# Patient Record
Sex: Male | Born: 1955 | Race: White | Hispanic: No | Marital: Married | Smoking: Current some day smoker
Health system: Southern US, Community
[De-identification: ages and names within clinical notes are randomized; demographics above are authoritative.]

## PROBLEM LIST (undated history)

## (undated) DIAGNOSIS — K219 Gastro-esophageal reflux disease without esophagitis: Secondary | ICD-10-CM

## (undated) DIAGNOSIS — M1990 Unspecified osteoarthritis, unspecified site: Secondary | ICD-10-CM

## (undated) DIAGNOSIS — K222 Esophageal obstruction: Secondary | ICD-10-CM

## (undated) DIAGNOSIS — E785 Hyperlipidemia, unspecified: Secondary | ICD-10-CM

## (undated) DIAGNOSIS — K589 Irritable bowel syndrome without diarrhea: Secondary | ICD-10-CM

## (undated) HISTORY — DX: Esophageal obstruction: K22.2

## (undated) HISTORY — DX: Irritable bowel syndrome without diarrhea: K58.9

## (undated) HISTORY — DX: Gastro-esophageal reflux disease without esophagitis: K21.9

## (undated) HISTORY — DX: Unspecified osteoarthritis, unspecified site: M19.90

## (undated) HISTORY — DX: Hyperlipidemia, unspecified: E78.5

## (undated) HISTORY — PX: APPENDECTOMY: SHX54

## (undated) HISTORY — PX: UPPER GASTROINTESTINAL ENDOSCOPY: SHX188

## (undated) HISTORY — PX: COLONOSCOPY: SHX174

---

## 2001-05-19 ENCOUNTER — Emergency Department (HOSPITAL_COMMUNITY): Admission: EM | Admit: 2001-05-19 | Discharge: 2001-05-19 | Payer: Self-pay | Admitting: Emergency Medicine

## 2001-05-19 ENCOUNTER — Encounter: Payer: Self-pay | Admitting: Emergency Medicine

## 2001-05-25 ENCOUNTER — Emergency Department (HOSPITAL_COMMUNITY): Admission: EM | Admit: 2001-05-25 | Discharge: 2001-05-26 | Payer: Self-pay | Admitting: Emergency Medicine

## 2002-05-25 ENCOUNTER — Encounter: Payer: Self-pay | Admitting: Internal Medicine

## 2002-06-21 ENCOUNTER — Encounter: Payer: Self-pay | Admitting: Internal Medicine

## 2002-06-24 ENCOUNTER — Encounter: Payer: Self-pay | Admitting: Internal Medicine

## 2002-06-24 ENCOUNTER — Ambulatory Visit (HOSPITAL_COMMUNITY): Admission: RE | Admit: 2002-06-24 | Discharge: 2002-06-24 | Payer: Self-pay | Admitting: Internal Medicine

## 2002-07-19 ENCOUNTER — Encounter: Payer: Self-pay | Admitting: Internal Medicine

## 2002-10-21 ENCOUNTER — Encounter: Admission: RE | Admit: 2002-10-21 | Discharge: 2002-10-21 | Payer: Self-pay | Admitting: Family Medicine

## 2002-10-21 ENCOUNTER — Encounter: Payer: Self-pay | Admitting: Family Medicine

## 2003-02-20 ENCOUNTER — Encounter: Payer: Self-pay | Admitting: Internal Medicine

## 2003-03-17 ENCOUNTER — Ambulatory Visit (HOSPITAL_COMMUNITY): Admission: RE | Admit: 2003-03-17 | Discharge: 2003-03-17 | Payer: Self-pay | Admitting: Internal Medicine

## 2003-03-17 ENCOUNTER — Encounter: Payer: Self-pay | Admitting: Internal Medicine

## 2003-03-20 ENCOUNTER — Ambulatory Visit (HOSPITAL_COMMUNITY): Admission: RE | Admit: 2003-03-20 | Discharge: 2003-03-20 | Payer: Self-pay | Admitting: Internal Medicine

## 2003-03-20 ENCOUNTER — Encounter: Payer: Self-pay | Admitting: Internal Medicine

## 2003-03-22 ENCOUNTER — Emergency Department (HOSPITAL_COMMUNITY): Admission: EM | Admit: 2003-03-22 | Discharge: 2003-03-22 | Payer: Self-pay | Admitting: Emergency Medicine

## 2004-08-29 ENCOUNTER — Ambulatory Visit: Payer: Self-pay | Admitting: Family Medicine

## 2005-06-18 ENCOUNTER — Ambulatory Visit: Payer: Self-pay | Admitting: Family Medicine

## 2005-06-24 ENCOUNTER — Ambulatory Visit: Payer: Self-pay | Admitting: Family Medicine

## 2006-06-25 ENCOUNTER — Ambulatory Visit: Payer: Self-pay | Admitting: Family Medicine

## 2006-07-01 ENCOUNTER — Ambulatory Visit: Payer: Self-pay | Admitting: Family Medicine

## 2006-08-26 ENCOUNTER — Ambulatory Visit: Payer: Self-pay | Admitting: Family Medicine

## 2007-03-02 ENCOUNTER — Ambulatory Visit: Payer: Self-pay | Admitting: Family Medicine

## 2007-03-02 LAB — CONVERTED CEMR LAB
Hep B S Ab: POSITIVE — AB
Rubella: 113.2 intl units/mL — ABNORMAL HIGH
Rubeola IgG: 2.58 — ABNORMAL HIGH

## 2007-03-05 ENCOUNTER — Encounter: Payer: Self-pay | Admitting: Family Medicine

## 2007-06-09 DIAGNOSIS — E785 Hyperlipidemia, unspecified: Secondary | ICD-10-CM | POA: Insufficient documentation

## 2007-06-24 ENCOUNTER — Ambulatory Visit (HOSPITAL_COMMUNITY): Admission: RE | Admit: 2007-06-24 | Discharge: 2007-06-24 | Payer: Self-pay | Admitting: Orthopedic Surgery

## 2007-07-08 ENCOUNTER — Ambulatory Visit: Payer: Self-pay | Admitting: Family Medicine

## 2007-07-08 ENCOUNTER — Encounter: Admission: RE | Admit: 2007-07-08 | Discharge: 2007-07-08 | Payer: Self-pay | Admitting: Orthopedic Surgery

## 2007-07-13 ENCOUNTER — Encounter: Payer: Self-pay | Admitting: Family Medicine

## 2007-07-20 LAB — CONVERTED CEMR LAB
AST: 19 units/L (ref 0–37)
Albumin: 4.1 g/dL (ref 3.5–5.2)
Basophils Relative: 0.9 % (ref 0.0–1.0)
CO2: 23 meq/L (ref 19–32)
Calcium: 9.2 mg/dL (ref 8.4–10.5)
Chloride: 107 meq/L (ref 96–112)
Creatinine, Ser: 1.2 mg/dL (ref 0.4–1.5)
Eosinophils Absolute: 0.3 10*3/uL (ref 0.0–0.6)
Eosinophils Relative: 4.9 % (ref 0.0–5.0)
GFR calc non Af Amer: 68 mL/min
MCHC: 35.2 g/dL (ref 30.0–36.0)
Monocytes Absolute: 0.5 10*3/uL (ref 0.2–0.7)
Neutro Abs: 3.2 10*3/uL (ref 1.4–7.7)
Platelets: 263 10*3/uL (ref 150–400)
RBC: 4.65 M/uL (ref 4.22–5.81)
Sed Rate: 9 mm/hr (ref 0–20)
WBC: 5.4 10*3/uL (ref 4.5–10.5)

## 2007-07-28 LAB — CONVERTED CEMR LAB
Albumin ELP: 61.5 % (ref 55.8–66.1)
Alpha-2-Globulin: 9.3 % (ref 7.1–11.8)
Beta Globulin: 5.4 % (ref 4.7–7.2)
Free Lambda Lt Chains,Ur: <0.08 mg/dL (ref 0.08–1.01)
Gamma Globulin: 14.1 % (ref 11.1–18.8)
IgM, Serum: 75 mg/dL (ref 60–263)
Total Protein, Serum Electrophoresis: 7.7 g/dL (ref 6.0–8.3)
Total Protein, Urine-Ur/day: 29 mg/24hr (ref 10–140)

## 2007-12-23 ENCOUNTER — Telehealth: Payer: Self-pay | Admitting: Family Medicine

## 2008-02-03 ENCOUNTER — Ambulatory Visit: Payer: Self-pay | Admitting: Family Medicine

## 2008-02-03 DIAGNOSIS — K5289 Other specified noninfective gastroenteritis and colitis: Secondary | ICD-10-CM

## 2008-02-03 DIAGNOSIS — E860 Dehydration: Secondary | ICD-10-CM | POA: Insufficient documentation

## 2008-02-03 DIAGNOSIS — J309 Allergic rhinitis, unspecified: Secondary | ICD-10-CM | POA: Insufficient documentation

## 2008-04-19 ENCOUNTER — Ambulatory Visit: Payer: Self-pay | Admitting: Family Medicine

## 2008-04-19 DIAGNOSIS — K645 Perianal venous thrombosis: Secondary | ICD-10-CM

## 2008-06-06 ENCOUNTER — Encounter: Payer: Self-pay | Admitting: Family Medicine

## 2008-08-03 ENCOUNTER — Ambulatory Visit: Payer: Self-pay | Admitting: Family Medicine

## 2008-08-03 DIAGNOSIS — R51 Headache: Secondary | ICD-10-CM | POA: Insufficient documentation

## 2008-08-03 DIAGNOSIS — R0789 Other chest pain: Secondary | ICD-10-CM

## 2008-08-03 DIAGNOSIS — S20219A Contusion of unspecified front wall of thorax, initial encounter: Secondary | ICD-10-CM | POA: Insufficient documentation

## 2008-08-03 DIAGNOSIS — R519 Headache, unspecified: Secondary | ICD-10-CM | POA: Insufficient documentation

## 2008-08-07 ENCOUNTER — Telehealth: Payer: Self-pay | Admitting: Family Medicine

## 2008-08-08 ENCOUNTER — Ambulatory Visit: Payer: Self-pay | Admitting: Family Medicine

## 2008-08-08 DIAGNOSIS — J019 Acute sinusitis, unspecified: Secondary | ICD-10-CM

## 2008-08-10 ENCOUNTER — Telehealth: Payer: Self-pay | Admitting: Family Medicine

## 2008-08-14 ENCOUNTER — Encounter: Admission: RE | Admit: 2008-08-14 | Discharge: 2008-08-14 | Payer: Self-pay | Admitting: Family Medicine

## 2008-08-14 ENCOUNTER — Encounter: Payer: Self-pay | Admitting: Nurse Practitioner

## 2008-08-14 ENCOUNTER — Telehealth (INDEPENDENT_AMBULATORY_CARE_PROVIDER_SITE_OTHER): Payer: Self-pay | Admitting: *Deleted

## 2008-08-14 ENCOUNTER — Ambulatory Visit: Payer: Self-pay | Admitting: Family Medicine

## 2008-08-15 ENCOUNTER — Inpatient Hospital Stay (HOSPITAL_COMMUNITY): Admission: AD | Admit: 2008-08-15 | Discharge: 2008-08-17 | Payer: Self-pay | Admitting: Family Medicine

## 2008-08-15 ENCOUNTER — Ambulatory Visit: Payer: Self-pay | Admitting: Family Medicine

## 2008-08-15 ENCOUNTER — Telehealth: Payer: Self-pay | Admitting: Family Medicine

## 2008-08-15 ENCOUNTER — Ambulatory Visit: Payer: Self-pay | Admitting: Internal Medicine

## 2008-08-15 DIAGNOSIS — R079 Chest pain, unspecified: Secondary | ICD-10-CM | POA: Insufficient documentation

## 2008-08-15 LAB — CONVERTED CEMR LAB
Albumin: 4.2 g/dL (ref 3.5–5.2)
Alkaline Phosphatase: 54 units/L (ref 39–117)
BUN: 14 mg/dL (ref 6–23)
Basophils Absolute: 0 10*3/uL (ref 0.0–0.1)
Basophils Relative: 0.6 % (ref 0.0–3.0)
CO2: 26 meq/L (ref 19–32)
Calcium: 9.1 mg/dL (ref 8.4–10.5)
Creatinine, Ser: 1.1 mg/dL (ref 0.4–1.5)
Eosinophils Absolute: 0.2 10*3/uL (ref 0.0–0.7)
Eosinophils Relative: 2.9 % (ref 0.0–5.0)
GFR calc non Af Amer: 75 mL/min
Glucose, Bld: 83 mg/dL (ref 70–99)
Lymphocytes Relative: 23.8 % (ref 12.0–46.0)
MCHC: 35.4 g/dL (ref 30.0–36.0)
Monocytes Relative: 8.5 % (ref 3.0–12.0)
Neutro Abs: 4.2 10*3/uL (ref 1.4–7.7)
Potassium: 4.2 meq/L (ref 3.5–5.1)
RBC: 5.11 M/uL (ref 4.22–5.81)
Sodium: 140 meq/L (ref 135–145)
Total Bilirubin: 1.2 mg/dL (ref 0.3–1.2)
Total Protein: 7.4 g/dL (ref 6.0–8.3)

## 2008-08-17 ENCOUNTER — Encounter (INDEPENDENT_AMBULATORY_CARE_PROVIDER_SITE_OTHER): Payer: Self-pay | Admitting: *Deleted

## 2008-08-17 ENCOUNTER — Telehealth: Payer: Self-pay | Admitting: Family Medicine

## 2008-08-21 ENCOUNTER — Telehealth: Payer: Self-pay | Admitting: Family Medicine

## 2008-08-22 ENCOUNTER — Ambulatory Visit: Payer: Self-pay | Admitting: Family Medicine

## 2008-08-23 ENCOUNTER — Ambulatory Visit: Payer: Self-pay | Admitting: Nurse Practitioner

## 2008-08-23 DIAGNOSIS — R197 Diarrhea, unspecified: Secondary | ICD-10-CM | POA: Insufficient documentation

## 2008-08-23 DIAGNOSIS — K589 Irritable bowel syndrome without diarrhea: Secondary | ICD-10-CM

## 2008-08-23 DIAGNOSIS — R1319 Other dysphagia: Secondary | ICD-10-CM

## 2008-08-23 DIAGNOSIS — R1084 Generalized abdominal pain: Secondary | ICD-10-CM

## 2008-08-23 DIAGNOSIS — R143 Flatulence: Secondary | ICD-10-CM

## 2008-08-23 DIAGNOSIS — R141 Gas pain: Secondary | ICD-10-CM | POA: Insufficient documentation

## 2008-08-23 DIAGNOSIS — R933 Abnormal findings on diagnostic imaging of other parts of digestive tract: Secondary | ICD-10-CM

## 2008-08-23 DIAGNOSIS — R112 Nausea with vomiting, unspecified: Secondary | ICD-10-CM

## 2008-08-23 DIAGNOSIS — R142 Eructation: Secondary | ICD-10-CM

## 2008-08-28 ENCOUNTER — Telehealth: Payer: Self-pay | Admitting: Gastroenterology

## 2008-08-28 ENCOUNTER — Telehealth: Payer: Self-pay | Admitting: Nurse Practitioner

## 2008-08-29 ENCOUNTER — Emergency Department (HOSPITAL_COMMUNITY): Admission: EM | Admit: 2008-08-29 | Discharge: 2008-08-29 | Payer: Self-pay | Admitting: Emergency Medicine

## 2008-08-29 ENCOUNTER — Telehealth: Payer: Self-pay | Admitting: Nurse Practitioner

## 2008-08-30 ENCOUNTER — Ambulatory Visit: Payer: Self-pay | Admitting: Family Medicine

## 2008-08-30 DIAGNOSIS — K219 Gastro-esophageal reflux disease without esophagitis: Secondary | ICD-10-CM | POA: Insufficient documentation

## 2008-08-30 DIAGNOSIS — R1013 Epigastric pain: Secondary | ICD-10-CM | POA: Insufficient documentation

## 2008-08-31 ENCOUNTER — Ambulatory Visit: Payer: Self-pay | Admitting: Internal Medicine

## 2008-08-31 DIAGNOSIS — Z9283 Personal history of failed moderate sedation: Secondary | ICD-10-CM

## 2008-09-01 ENCOUNTER — Telehealth: Payer: Self-pay | Admitting: Family Medicine

## 2008-09-01 ENCOUNTER — Encounter: Payer: Self-pay | Admitting: Internal Medicine

## 2008-09-01 ENCOUNTER — Telehealth: Payer: Self-pay | Admitting: Internal Medicine

## 2008-09-19 ENCOUNTER — Ambulatory Visit: Payer: Self-pay | Admitting: Internal Medicine

## 2008-09-29 DIAGNOSIS — K222 Esophageal obstruction: Secondary | ICD-10-CM

## 2008-09-29 HISTORY — DX: Esophageal obstruction: K22.2

## 2008-10-02 ENCOUNTER — Telehealth: Payer: Self-pay | Admitting: Internal Medicine

## 2008-10-05 ENCOUNTER — Ambulatory Visit: Payer: Self-pay | Admitting: Internal Medicine

## 2008-10-05 ENCOUNTER — Ambulatory Visit (HOSPITAL_COMMUNITY): Admission: RE | Admit: 2008-10-05 | Discharge: 2008-10-05 | Payer: Self-pay | Admitting: Internal Medicine

## 2008-10-05 LAB — HM COLONOSCOPY

## 2008-11-18 IMAGING — CT CT ABDOMEN W/ CM
3 of 5 series · 17 of 46 positions shown, 19 images · IV contrast ([ID] [id number])
Comparison: None available

CT ABDOMEN

CLINICAL DATA: Abdominal pain and vomiting, mid lower abdominal
pain.  Prior appendectomy

CT ABDOMEN AND PELVIS WITH CONTRAST
TECHNIQUE: Multidetector CT imaging of the abdomen and pelvis was
performed using the standard protocol following bolus
administration of intravenous contrast.
Contrast: 100 ml  Omni 300

[Series 2: abdomen w/ · axial · 0.70mm/px · z∈[-363,-43]mm · 10 of 80 slices shown, 12 images]
[im 8/80  soft-tissue]
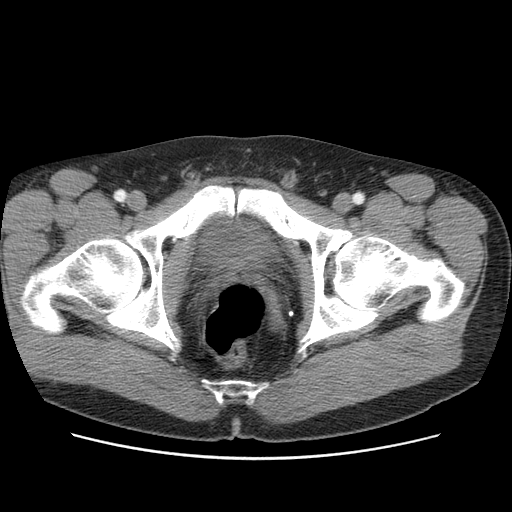
[im 8/80  bone]
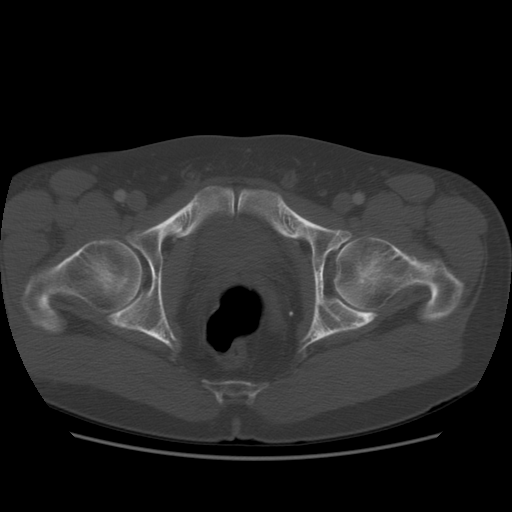
[im 15/80  soft-tissue]
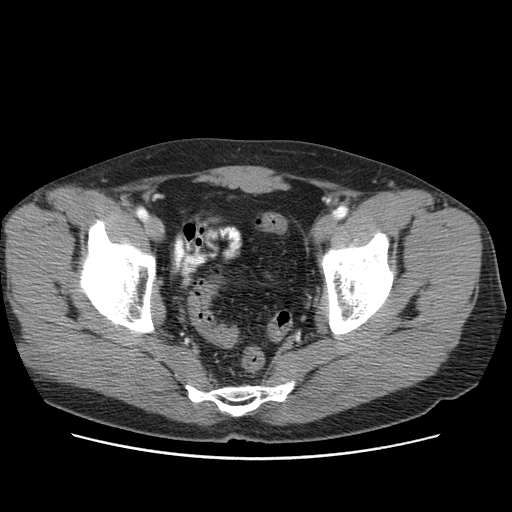
[im 22/80  soft-tissue]
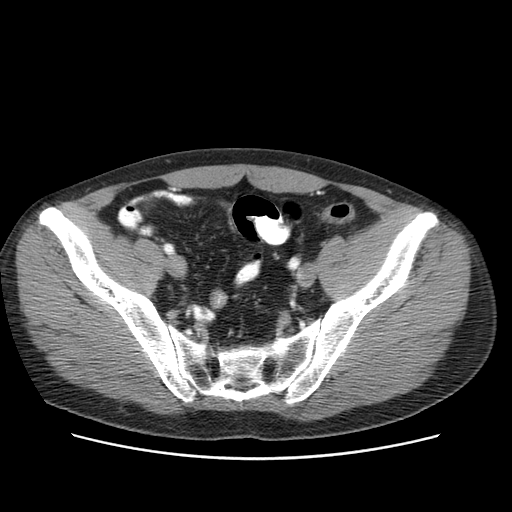
[im 29/80  soft-tissue]
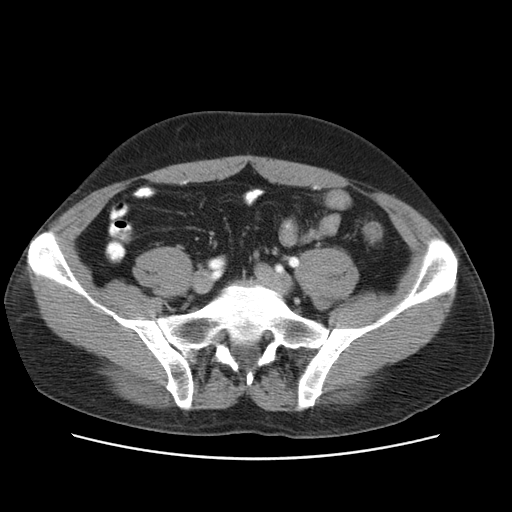
[im 36/80  soft-tissue]
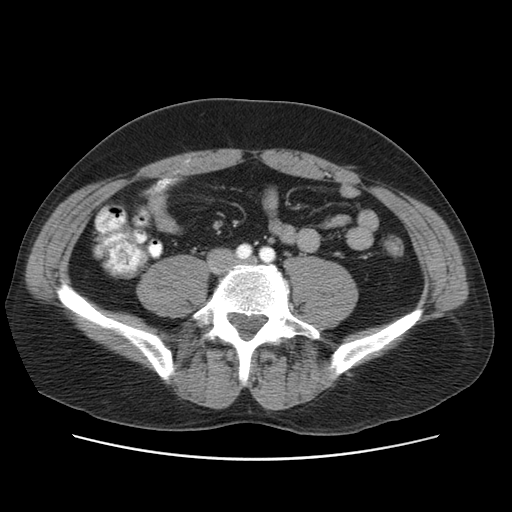
[im 44/80  soft-tissue]
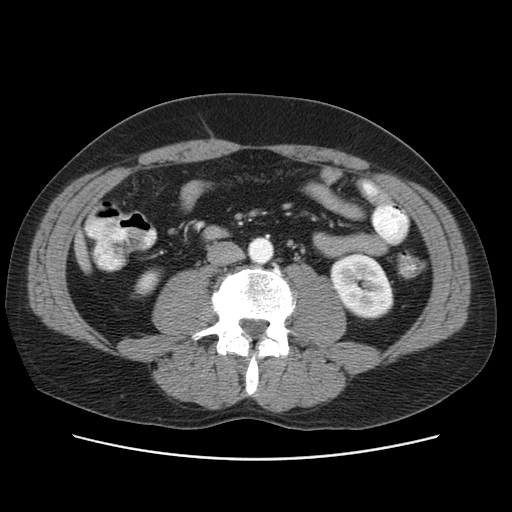
[im 51/80  soft-tissue]
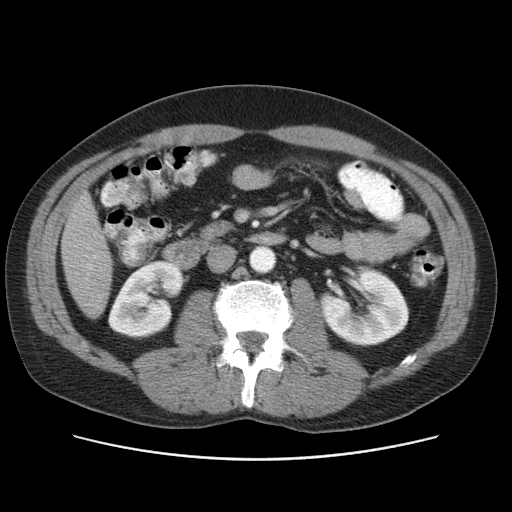
[im 58/80  soft-tissue]
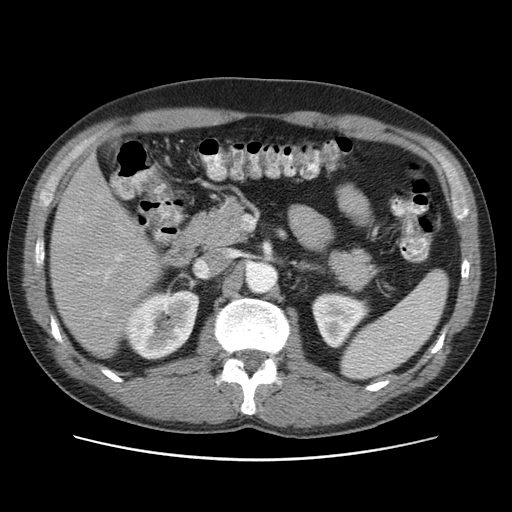
[im 65/80  soft-tissue]
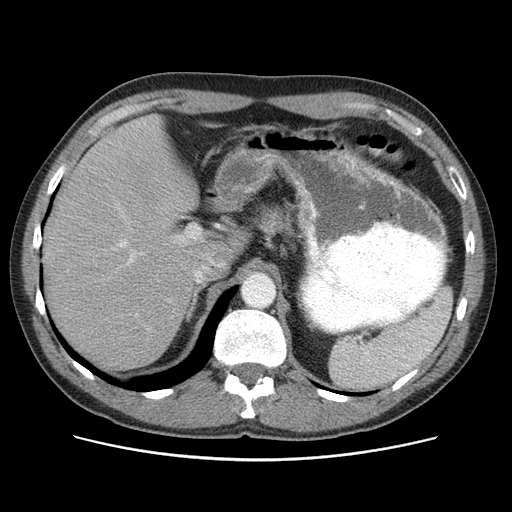
[im 65/80  bone]
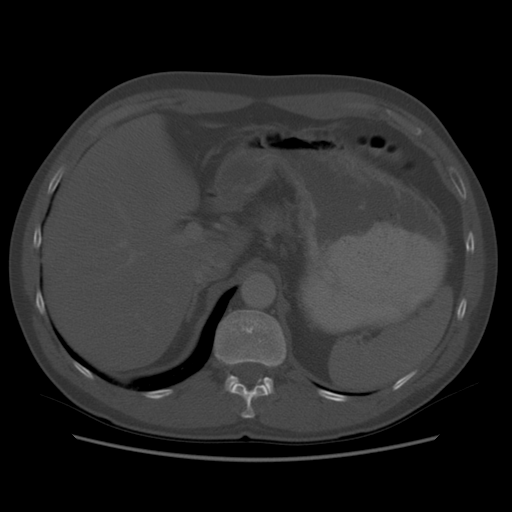
[im 72/80  soft-tissue]
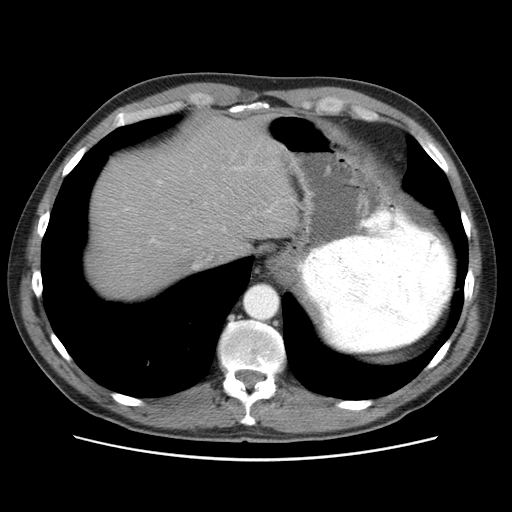

[Series 3: lung windows · axial · 0.70mm/px · z∈[-438,-273]mm · 4 of 94 slices shown]
[im 7/94  bone]
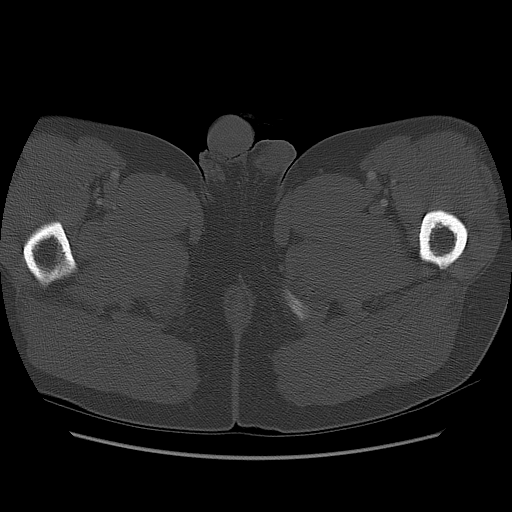
[im 20/94  bone]
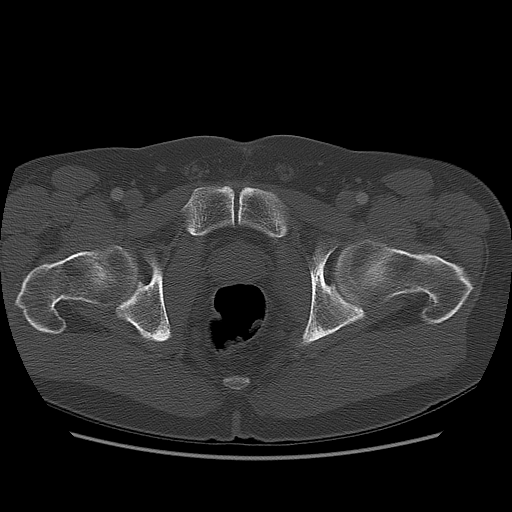
[im 34/94  bone]
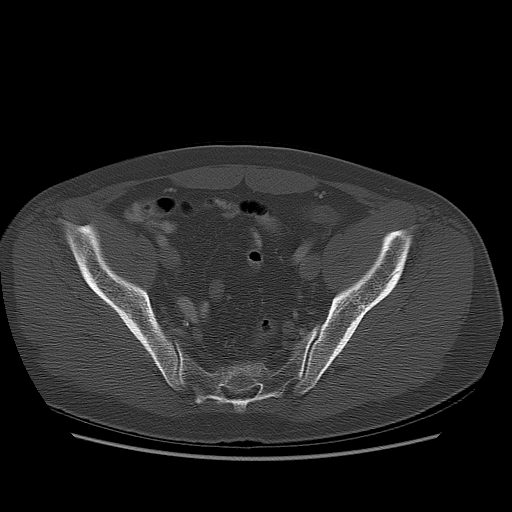
[im 40/94  bone]
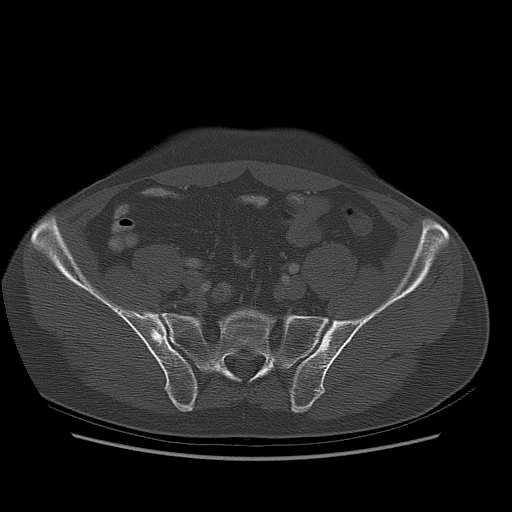

[Series 104: cor · coronal · 0.93mm/px · 3 of 99 slices shown]
[im 33/99  soft-tissue]
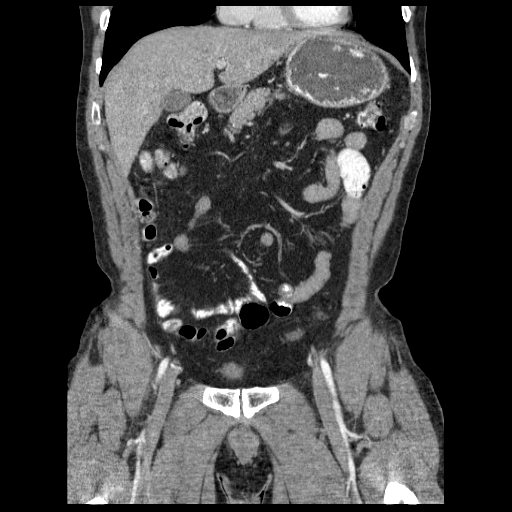
[im 44/99  soft-tissue]
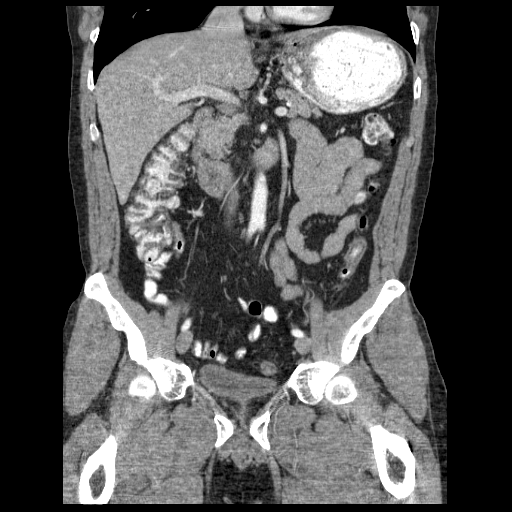
[im 55/99  soft-tissue]
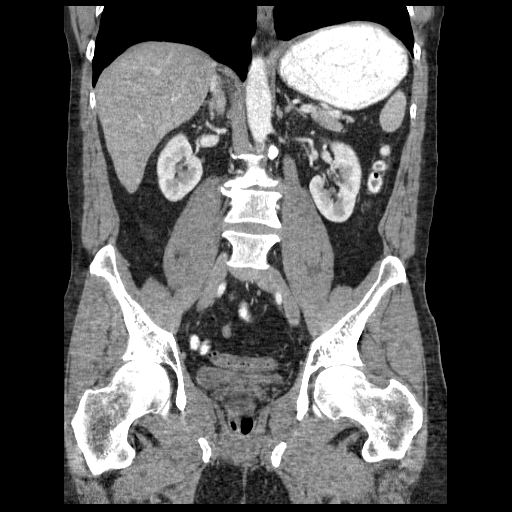

[17 of 46 positions shown; findings below may reference images not displayed]

FINDINGS: Lung bases are clear.  Base of the heart appears normal.

No focal hepatic lesion.  The gallbladder, pancreas, spleen,
adrenal glands, and kidneys appear normal.

The stomach, duodenum, small bowel, and cecum appear normal.  There
is very mild pericolonic stranding along the distal portion of the
descending colon over approximately 5 cm segment.  There is mild
diverticular disease in this region.  There is more prominent
diverticulosis of the sigmoid colon but no clear evidence of acute
inflammation.  No evidence of abscess or perforation.

The abdominal aorta is normal in caliber.  No evidence of
retroperitoneal lymphadenopathy.
IMPRESSION: Mild pericolonic stranding of the descending colon.  Differential
includes mild / early diverticulitis or potential segmental
colitis.

CT PELVIS
FINDINGS: The bladder, prostate, seminal vesicles appear normal.
The rectum appears normal.  There is scattered diverticula of the
sigmoid colon without clear evidence of inflammation.

No evidence of pelvic lymphadenopathy. Review of  bone windows
demonstrates no aggressive osseous lesions. There is loss of disc
height at L3-L4 with associated endplate osteophytosis.  No
subluxation.
IMPRESSION: 1.  No acute pelvic process.
2.  Sigmoid diverticulosis.
3.  Degenerative disc disease at L3-L4.

## 2009-05-16 ENCOUNTER — Ambulatory Visit: Payer: Self-pay | Admitting: Family Medicine

## 2009-05-23 ENCOUNTER — Ambulatory Visit: Payer: Self-pay | Admitting: Family Medicine

## 2009-05-23 DIAGNOSIS — F988 Other specified behavioral and emotional disorders with onset usually occurring in childhood and adolescence: Secondary | ICD-10-CM | POA: Insufficient documentation

## 2009-05-23 DIAGNOSIS — M1710 Unilateral primary osteoarthritis, unspecified knee: Secondary | ICD-10-CM | POA: Insufficient documentation

## 2009-05-23 DIAGNOSIS — IMO0002 Reserved for concepts with insufficient information to code with codable children: Secondary | ICD-10-CM

## 2009-05-28 ENCOUNTER — Encounter: Payer: Self-pay | Admitting: Family Medicine

## 2009-06-02 LAB — CONVERTED CEMR LAB
ALT: 22 units/L (ref 0–53)
AST: 24 units/L (ref 0–37)
Albumin: 3.9 g/dL (ref 3.5–5.2)
Basophils Absolute: 0.1 10*3/uL (ref 0.0–0.1)
Bilirubin Urine: NEGATIVE
Direct LDL: 123.1 mg/dL
Glucose, Bld: 108 mg/dL — ABNORMAL HIGH (ref 70–99)
HCT: 42 % (ref 39.0–52.0)
HDL: 29.2 mg/dL — ABNORMAL LOW (ref >39.00)
Hemoglobin, Urine: NEGATIVE
Hemoglobin: 14.7 g/dL (ref 13.0–17.0)
Leukocytes, UA: NEGATIVE
Lymphocytes Relative: 27 % (ref 12.0–46.0)
MCHC: 35.1 g/dL (ref 30.0–36.0)
Neutro Abs: 2.8 10*3/uL (ref 1.4–7.7)
Neutrophils Relative %: 56.6 % (ref 43.0–77.0)
PSA: 0.42 ng/mL (ref 0.10–4.00)
RDW: 12 % (ref 11.5–14.6)
Specific Gravity, Urine: 1.025 (ref 1.000–1.030)
TSH: 2.89 microintl units/mL (ref 0.35–5.50)
Total Bilirubin: 0.9 mg/dL (ref 0.3–1.2)
Total CHOL/HDL Ratio: 8
Total Protein: 6.8 g/dL (ref 6.0–8.3)
Urine Glucose: NEGATIVE mg/dL
WBC: 5 10*3/uL (ref 4.5–10.5)

## 2009-07-11 ENCOUNTER — Ambulatory Visit: Payer: Self-pay | Admitting: Family Medicine

## 2009-07-11 DIAGNOSIS — F341 Dysthymic disorder: Secondary | ICD-10-CM | POA: Insufficient documentation

## 2009-09-11 ENCOUNTER — Telehealth: Payer: Self-pay | Admitting: Family Medicine

## 2009-10-31 ENCOUNTER — Telehealth: Payer: Self-pay | Admitting: Family Medicine

## 2009-11-07 ENCOUNTER — Telehealth: Payer: Self-pay | Admitting: Family Medicine

## 2009-11-07 ENCOUNTER — Ambulatory Visit: Payer: Self-pay | Admitting: Family Medicine

## 2009-11-07 DIAGNOSIS — J1189 Influenza due to unidentified influenza virus with other manifestations: Secondary | ICD-10-CM | POA: Insufficient documentation

## 2009-11-08 LAB — CONVERTED CEMR LAB
Basophils Absolute: 0 10*3/uL (ref 0.0–0.1)
Eosinophils Relative: 1.5 % (ref 0.0–5.0)
HCT: 42.2 % (ref 39.0–52.0)
Lymphocytes Relative: 9.4 % — ABNORMAL LOW (ref 12.0–46.0)
Lymphs Abs: 0.4 10*3/uL — ABNORMAL LOW (ref 0.7–4.0)
MCV: 98.3 fL (ref 78.0–100.0)
Monocytes Absolute: 0.6 10*3/uL (ref 0.1–1.0)
Neutro Abs: 3.3 10*3/uL (ref 1.4–7.7)
RBC: 4.29 M/uL (ref 4.22–5.81)

## 2010-05-16 ENCOUNTER — Telehealth: Payer: Self-pay | Admitting: Family Medicine

## 2010-05-30 ENCOUNTER — Telehealth: Payer: Self-pay | Admitting: Family Medicine

## 2010-06-14 ENCOUNTER — Telehealth: Payer: Self-pay | Admitting: Family Medicine

## 2010-09-04 ENCOUNTER — Telehealth: Payer: Self-pay | Admitting: Family Medicine

## 2010-10-20 ENCOUNTER — Encounter: Payer: Self-pay | Admitting: Family Medicine

## 2010-10-27 LAB — CONVERTED CEMR LAB
Alkaline Phosphatase: 57 units/L (ref 39–117)
Basophils Absolute: 0 10*3/uL (ref 0.0–0.1)
Blood in Urine, dipstick: NEGATIVE
Chloride: 106 meq/L (ref 96–112)
GFR calc Af Amer: 102 mL/min
GFR calc non Af Amer: 84 mL/min
Hemoglobin: 16 g/dL (ref 13.0–17.0)
Ketones, urine, test strip: NEGATIVE
Lymphocytes Relative: 21.9 % (ref 12.0–46.0)
MCHC: 35.5 g/dL (ref 30.0–36.0)
Neutro Abs: 5 10*3/uL (ref 1.4–7.7)
Nitrite: NEGATIVE
PSA: 0.56 ng/mL (ref 0.10–4.00)
Platelets: 227 10*3/uL (ref 150–400)
RDW: 11.6 % (ref 11.5–14.6)
Sodium: 138 meq/L (ref 135–145)
VLDL: 29 mg/dL (ref 0–40)
Vit D, 1,25-Dihydroxy: 30 (ref 30–89)
WBC Urine, dipstick: NEGATIVE
WBC: 7.4 10*3/uL (ref 4.5–10.5)
pH: 6.5

## 2010-10-29 NOTE — Progress Notes (Signed)
Summary: CALL CONCERNING RX  Phone Note Call from Patient   Caller: Patient [phone] Reason for Call: Talk to Nurse, Talk to Doctor Summary of Call: Pt called to adv that he wasn't given a RX for his cough med: HYDROMET 5-1.5 MG/5ML SYRP (HYDROCODONE-HOMATROPINE) .Marland Kitchen... Pt went to pharmacy to p/u meds and they adv him that they only received RX for Tamiflu.... EMR shows that the Hydromet Syrup was printed and given to pt.... However, pt adv he never received same...Marland KitchenMarland KitchenAlmira Coaster, RN presently n/a.  Pt can be reached at [phone]  -or-  [phone].  Initial call taken by: Debbra Riding,  November 07, 2009 12:06 PM  Follow-up for Phone Call        Phone Call Completed----Spoke with pt.... Pt was adv that Almira Coaster, RN stated that she would call pharmacy to verify that RX was received. Follow-up by: Debbra Riding,  November 07, 2009 12:16 PM

## 2010-10-29 NOTE — Progress Notes (Signed)
Summary: wants rx for adderall   Phone Note Call from Patient   Caller: Patient Summary of Call: pt called to say would pick up rx. for his ADD med  per Nelva Bush rx not at desk and pt did not siign log sheet indicatiing he picked up is adderall. Initial call taken by: Pura Spice, RN,  May 30, 2010 11:41 AM  Follow-up for Phone Call        Please call the two pharmacies listed in his chart and see if he has picked up any Adderall since july if he has not then he can have a one month supply now and another in 1 month Follow-up by: Danise Edge MD,  May 30, 2010 4:09 PM  Additional Follow-up for Phone Call Additional follow up Details #1::        OK to give him the 30 day supply and he can have another refill next month. [facility]  last filled in May 2011 Additional Follow-up by: Danise Edge MD,  May 30, 2010 4:31 PM     Appended Document: wants rx for adderall     Clinical Lists Changes  Medications: Changed medication from ADDERALL 20 MG TABS (AMPHETAMINE-DEXTROAMPHETAMINE) 1 morning  and evening meal for  ADD fill May 17 2010 to ADDERALL 20 MG TABS (AMPHETAMINE-DEXTROAMPHETAMINE) 1 morning  and evening meal for  ADD - Signed Rx of ADDERALL 20 MG TABS (AMPHETAMINE-DEXTROAMPHETAMINE) 1 morning  and evening meal for  ADD;  #60 x 0;  Signed;  Entered by: Josph Macho RMA;  Authorized by: Danise Edge MD;  Method used: Print then Give to Patient    Prescriptions: ADDERALL 20 MG TABS (AMPHETAMINE-DEXTROAMPHETAMINE) 1 morning  and evening meal for  ADD  #60 x 0   Entered by:   Josph Macho RMA   Authorized by:   Danise Edge MD   Signed by:   Josph Macho RMA on 05/30/2010   Method used:   Print then Give to Patient   RxID:   [id number]   RX in front cabinet/ CF

## 2010-10-29 NOTE — Progress Notes (Signed)
Summary: new rx 3 months  Phone Note Refill Request Call back at [phone] Message from:  Patient  Refills Requested: Medication #1:  ADDERALL 20 MG TABS 1 morning lunch and evening meal for  ADD pt is requesting generic med and also written 3 month supply  Initial call taken by: Heron Sabins,  May 16, 2010 9:10 AM  Follow-up for Phone Call        ok per dr Scotty Court.  can pick up today rx front desk  Follow-up by: Pura Spice, RN,  May 16, 2010 2:50 PM    New/Updated Medications: ADDERALL 20 MG TABS (AMPHETAMINE-DEXTROAMPHETAMINE) 1 morning  and evening meal for  ADD fill May 17 2010 ADDERALL 20 MG TABS (AMPHETAMINE-DEXTROAMPHETAMINE) One by mouth morning and evening Fill on 06/17/2010 ADDERALL 20 MG TABS (AMPHETAMINE-DEXTROAMPHETAMINE) one morning and evening Fill on Jul 17 2010 Prescriptions: ADDERALL 20 MG TABS (AMPHETAMINE-DEXTROAMPHETAMINE) one morning and evening Fill on Jul 17 2010  #60 x 0   Entered by:   Pura Spice, RN   Authorized by:   Judithann Sheen MD   Signed by:   Pura Spice, RN on 05/16/2010   Method used:   Print then Give to Patient   RxID:   [id number] ADDERALL 20 MG TABS (AMPHETAMINE-DEXTROAMPHETAMINE) One by mouth morning and evening Fill on 06/17/2010  #60 x 0   Entered by:   Pura Spice, RN   Authorized by:   Judithann Sheen MD   Signed by:   Pura Spice, RN on 05/16/2010   Method used:   Print then Give to Patient   RxID:   [id number] ADDERALL 20 MG TABS (AMPHETAMINE-DEXTROAMPHETAMINE) 1 morning  and evening meal for  ADD fill May 17 2010  #60 x 0   Entered by:   Pura Spice, RN   Authorized by:   Judithann Sheen MD   Signed by:   Pura Spice, RN on 05/16/2010   Method used:   Print then Give to Patient   RxID:   [id number]   Appended Document: new rx 3 months pt states never picked up and per Nelva Bush rx not at front desk will send phone note to Dr Abner Greenspan. Pt wants to pick up by 5 pm today  .gh rn........Marland Kitchen

## 2010-10-29 NOTE — Assessment & Plan Note (Signed)
Summary: SORE THROAT / H/A / SINUSITIS // RS   Vital Signs:  Patient profile:   56 year old male Weight:      186 pounds BMI:     24.63 O2 Sat:      98 % Temp:     98.4 degrees F Pulse rate:   77 / minute BP sitting:   120 / 84  (left arm)  Vitals Entered By: Pura Spice, RN (November 07, 2009 9:58 AM) CC: cough sore throat headache.states wetnt to [place] bowl and did alot hollering and now feels hoarse and feels like knot in chest.  Is Patient Diabetic? No   History of Present Illness: this 55 year old white male is complaining of cough sore throat headache generalized aching and fever Went to [employer] [place] game and was yelling at the players and became hoarse over the next 2 days he began having the symptoms as described a low as well as complaint of a knot in his substernal area. Symptoms increased in severity over the past 2 days The patient brought up the fact that he continues to be anxious and depressed and he has no purpose since he had to become a home mom, desires to see either a psychiatrist or a therapist. 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  Allergies: 1)  ! Lipitor (Atorvastatin Calcium) 2)  ! Augmentin 3)  Codeine Phosphate (Codeine Phosphate)  Past History:  Past Medical History: Last updated: 08/31/2008 Hyperlipidemia Arthritis Hemorrhoids Irritable Bowel Syndrome (NL Colonoscopy 2003) GERD/dysphagia (EGD/dili 2004)  Past Surgical History: Last updated: 08/31/2008 Appendectomy  Social History: Last updated: 08/23/2008 Occupation: Sstudent Patient currently smokes. - cigars Alcohol Use - yes -1 Daily Caffeine Use -4 cups coffee Illicit Drug Use - no  Risk Factors: Smoking Status: current (08/23/2008)  Review of Systems  The patient denies anorexia, fever, weight loss, weight gain, vision loss, decreased  hearing, hoarseness, chest pain, syncope, dyspnea on exertion, peripheral edema, prolonged cough, headaches, hemoptysis, abdominal pain, melena, hematochezia, severe indigestion/heartburn, hematuria, incontinence, genital sores, muscle weakness, suspicious skin lesions, transient blindness, difficulty walking, depression, unusual weight change, abnormal bleeding, enlarged lymph nodes, angioedema, breast masses, and testicular masses.    Physical Exam  General:  A well-developed well-nourished slightly dehydrated male who appears acutely ill Head:  Normocephalic and atraumatic without obvious abnormalities. No apparent alopecia or balding. Eyes:  No corneal or conjunctival inflammation noted. EOMI. Perrla. Funduscopic exam benign, without hemorrhages, exudates or papilledema. Vision grossly normal. Ears:  External ear exam shows no significant lesions or deformities.  Otoscopic examination reveals clear canals, tympanic membranes are intact bilaterally without bulging, retraction, inflammation or discharge. Hearing is grossly normal bilaterally. Nose:  minimal nasal congestion Mouth:  mildly erythematous pharynx No lymphadenopathy Neck:  No deformities, masses, or tenderness noted. Chest Wall:  No deformities, masses, tenderness or gynecomastia noted. Lungs:  minimal rhonchi, no rales no wheezing no dullness to percussion Heart:  Normal rate and regular rhythm. S1 and S2 normal without gallop, murmur, click, rub or other extra sounds. Abdomen:  Bowel sounds positive,abdomen soft and non-tender without masses, organomegaly or hernias noted. Msk:  No deformity or scoliosis noted of thoracic or lumbar spine.   Extremities:  No clubbing, cyanosis, edema, or deformity noted with normal full range of motion of all joints.     Impression & Recommendations:  Problem # 1:  INFLUENZA (ICD-487.8) Assessment New  His updated medication list for this problem includes:    Valtrex 1 Gm Tabs (Valacyclovir  hcl) .Marland Kitchen... Take 1 tablet by mouth twice a day Tamiflu 75 mg b.i.d. for 5 days Orders: TLB-CBC Platelet - w/Differential (85025-CBCD)  Problem # 2:  ANXIETY DEPRESSION (ICD-300.4) Assessment: Deteriorated Lexapro 10 mg q.d.  Problem # 3:  ARTHRITIS, KNEE (ICD-716.96) Assessment: Improved  Problem # 4:  GERD (ICD-530.81) Assessment: Unchanged  His updated medication list for this problem includes:    Carafate 1 Gm/39ml Susp (Sucralfate) ..... One tablespoon every 6 hours    Levbid 0.375 Mg Xr12h-tab (Hyoscyamine sulfate) .Marland Kitchen... Take 1 two times a day    Nexium 40 Mg Cpdr (Esomeprazole magnesium) .Marland Kitchen... 1 qd  Problem # 5:  GERD (ICD-530.81)  Complete Medication List: 1)  Adult Aspirin Low Strength 81 Mg Chew (Aspirin) .... Take 1 tablet by mouth once a  day 2)  Valtrex 1 Gm Tabs (Valacyclovir hcl) .... Take 1 tablet by mouth twice a day 3)  Fish Oil 300 Mg Caps (Omega-3 fatty acids) 4)  Multivitamins Tabs (Multiple vitamin) 5)  Clobetasol Propionate E 0.05 % Crea (Clobetasol prop emollient base) .... As needed 6)  Omnaris 50 Mcg/act Susp (Ciclesonide) .... 2 sprays each nostril once daily for rhinitis 7)  Promethazine Hcl 25 Mg Tabs (Promethazine hcl) .Marland Kitchen.. 1 every 4 hours as needed  nausea 8)  Lomotil 2.5-0.025 Mg Tabs (Diphenoxylate-atropine) .Marland Kitchen.. 1 or 2 before meals and at bedtime as needed diarrhea 9)  Carafate 1 Gm/84ml Susp (Sucralfate) .... One tablespoon every 6 hours 10)  Promethazine Hcl 25 Mg Tabs (Promethazine hcl) .... Take 1 tab every 6 hours for nausea 11)  Levbid 0.375 Mg Xr12h-tab (Hyoscyamine sulfate) .... Take 1 two times a day 12)  Adderall 20 Mg Tabs (Amphetamine-dextroamphetamine) .Marland Kitchen.. 1 morning lunch and evening meal for  add 13)  Ibuprofen 600 Mg Tabs (Ibuprofen) .Marland Kitchen.. 1 three times a day for arthritis or inflammation 14)  Nexium 40 Mg Cpdr (Esomeprazole magnesium) .Marland Kitchen.. 1 qd 15)  Adderall 20 Mg Tabs (Amphetamine-dextroamphetamine) .... One by mouth morning and  evening fill on or after 11/28/2009 16)  Adderall 20 Mg Tabs (Amphetamine-dextroamphetamine) .... One morning and evening fill on or after 12/29/2009 17)  Tamiflu 75 Mg Caps (Oseltamivir phosphate) .Marland Kitchen.. 1 cap two times a day for 5 days 18)  Hydromet 5-1.5 Mg/22ml Syrp (Hydrocodone-homatropine) .... 2 tsp q4h as needed for cough 19)  Lexapro 10 Mg Tabs (Escitalopram oxalate) .Marland Kitchen.. 1 qd  Patient Instructions: 1)  Symptons of influenza, laryngitis 2)  Tamiflu 1 two times a day  3)  for 5 days 4)  good fluid intake, stay well hydrated 5)  take Ibuprofen 600 mg four times each day 6)  Hydromet cough expectorant  1-2 tsp every 4 hours if need ed fior 7)  cough 8)  To start Lexapro 10 mg each day for anxiety and depression 9)  To refer to a psychotherapist Prescriptions: HYDROMET 5-1.5 MG/5ML SYRP (HYDROCODONE-HOMATROPINE) 2 tsp q4h as needed for cough  #240cc x 1   Entered and Authorized by:   Judithann Sheen MD   Signed by:   Judithann Sheen MD on 11/07/2009   Method used:   Print then Give to Patient   RxID:   [id number] TAMIFLU 75 MG CAPS (OSELTAMIVIR PHOSPHATE) 1 cap two times a day for 5 days  #10 x 0   Entered and Authorized  by:   Judithann Sheen MD   Signed by:   Judithann Sheen MD on 11/07/2009   Method used:   [facility]  [address] [phone]       [address]       [place], [place]  [address]       Ph: [phone] or [phone]       Fax: [phone]   RxID:   [id number]

## 2010-10-29 NOTE — Progress Notes (Signed)
Summary: Pt req new script for Valtrex.[facility] [place]  Phone Note Refill Request Call back at [phone]    Refills Requested: Medication #1:  VALTREX 1 GM TABS Take 1 tablet by mouth twice a day   Dosage confirmed as above?Dosage Confirmed   Brand Name Necessary? Yes   Supply Requested: 1 year [facility]s [facility] [place] [phone]   Method Requested: Telephone to Pharmacy Initial call taken by: Lucy Antigua,  June 14, 2010 10:45 AM  Follow-up for Phone Call        OK to give 3 month supply with 1 rf. same sig, so disp 180 with 1 rf Follow-up by: Danise Edge MD,  June 14, 2010 12:01 PM  Additional Follow-up for Phone Call Additional follow up Details #1::        Patient informed Additional Follow-up by: Josph Macho RMA,  June 14, 2010 12:11 PM    Prescriptions: VALTREX 1 GM TABS (VALACYCLOVIR HCL) Take 1 tablet by mouth twice a day  #180 x 1   Entered by:   Josph Macho RMA   Authorized by:   Danise Edge MD   Signed by:   Josph Macho RMA on 06/14/2010   Method used:   [facility]        [facility]  [address] [phone] ([facility]ail[facility]       [address]       [place], [place]  [address]       Ph: [phone] or [phone]       Fax: [phone]   RxID:   [id number]

## 2010-10-29 NOTE — Progress Notes (Signed)
  Phone Note Call from Patient Call back at Home Phone [phone]      New/Updated Medications: ADDERALL 20 MG TABS (AMPHETAMINE-DEXTROAMPHETAMINE) One by mouth morning and evening Fill on or after 11/28/2009 ADDERALL 20 MG TABS (AMPHETAMINE-DEXTROAMPHETAMINE) one morning and evening Fill on or after 12/29/2009 Prescriptions: ADDERALL 20 MG TABS (AMPHETAMINE-DEXTROAMPHETAMINE) One by mouth morning and evening Fill on or after 11/28/2009  #60 x 0   Entered by:   Lynann Beaver CMA   Authorized by:   Judithann Sheen MD   Signed by:   Lynann Beaver CMA on 10/31/2009   Method used:   Print then Give to Patient   RxID:   [id number] ADDERALL 20 MG TABS (AMPHETAMINE-DEXTROAMPHETAMINE) one morning and evening Fill on or after 12/29/2009  #60 x 0   Entered by:   Lynann Beaver CMA   Authorized by:   Judithann Sheen MD   Signed by:   Lynann Beaver CMA on 10/31/2009   Method used:   Print then Give to Patient   RxID:   [id number] ADDERALL 20 MG TABS (AMPHETAMINE-DEXTROAMPHETAMINE) 1 morning lunch and evening meal for  ADD  #60 x 0   Entered by:   Lynann Beaver CMA   Authorized by:   Judithann Sheen MD   Signed by:   Lynann Beaver CMA on 10/31/2009   Method used:   Print then Give to Patient   RxID:   [id number]

## 2010-10-31 NOTE — Progress Notes (Signed)
Summary: new rx  Phone Note Refill Request Call back at Home Phone [phone] Call back at [phone]   Refills Requested: Medication #1:  ADDERALL 20 MG TABS 1 morning  and evening meal for  ADD pt needs new rx  Initial call taken by: Heron Sabins,  September 04, 2010 4:37 PM  Follow-up for Phone Call        PT IS AWARE [facility] OUT OF OFFICE UNTIL TOMORROW Follow-up by: Heron Sabins,  September 09, 2010 10:35 AM  Additional Follow-up for Phone Call Additional follow up Details #1::        Pts wife called to ck on status of refill request, c/b # [phone].  Additional Follow-up by: Debbra Riding,  September 12, 2010 9:23 AM    Additional Follow-up for Phone Call Additional follow up Details #2::    Pts wife called and is checking on status of Adderall script. Pls sign script and call pt asap for pick up. Pt only has a coupld of pills lft.  Follow-up by: Lucy Antigua,  September 16, 2010 10:56 AM  Additional Follow-up for Phone Call Additional follow up Details #3:: Details for Additional Follow-up Action Taken: per Carollee Herter the rx should be up front ready for p/u.   Pt informed..... Debbra Riding  September 17, 2010 3:44 PM  Additional Follow-up by: Alfred Levins, CMA,  September 17, 2010 3:33 PM  Prescriptions: ADDERALL 20 MG TABS (AMPHETAMINE-DEXTROAMPHETAMINE) 1 morning  and evening meal for  ADD  #60 x 0   Entered by:   Romualdo Bolk, CMA (AAMA)   Authorized by:   Judithann Sheen MD   Signed by:   Romualdo Bolk, CMA (AAMA) on 09/12/2010   Method used:   Print then Give to Patient   RxID:   [id number]

## 2011-02-11 NOTE — Discharge Summary (Signed)
NAME:  Jacob Savage, Jacob Savage NO.:  [id number]   MEDICAL RECORD NO.:  [id number]          PATIENT TYPE:  INP   LOCATION:  1341                         FACILITY:  [facility]   PHYSICIAN:  Raenette Rover. Felicity Coyer, MDDATE OF BIRTH:  January 01, 1956   DATE OF ADMISSION:  08/15/2008  DATE OF DISCHARGE:  08/17/2008                               DISCHARGE SUMMARY   DISCHARGE DIAGNOSES:  1. Nausea, vomiting, diarrhea, abdominal pain with suspected adverse      drug reaction and early diverticulitis.  2. Mild dehydration secondary to #1.   HISTORY OF PRESENT ILLNESS:  Mr. Dudding is a 55 year old white male  with past medical history of hyperlipidemia who presented to [facility]  with nausea and vomiting.  Of note, the patient was seen in office on  November 5th and treated for allergic rhinitis.  The patient was seen  again by primary physician on November 10th for continued nasal drainage  with nausea, vomiting and crampy abdominal pain.  The patient was placed  on Augmentin for suspected acute sinusitis.  The patient took a total of  5 days of Augmentin, stopping on November 12th due to persistent nausea  and diarrhea.  The patient presented to primary care physician's office  on day of admission with reports of continued left lower quadrant  abdominal pain, nausea, vomiting.  Of note, patient with no recurrent  diarrhea since several days prior to this admission.  The patient unable  to tolerate p.o.  Outpatient CT of the abdomen and pelvis was done on  November 16th.  Please see results below.  Again, diarrhea resolved when  stopping antibiotics.   PAST MEDICAL HISTORY:  1. Hyperlipidemia.  2. Appendectomy.   COURSE OF HOSPITALIZATION:  1. Nausea, vomiting, diarrhea and abdominal pain.  Again, the patient      underwent outpatient CT of abdomen and pelvis prior to this      admission revealing mild pericolonic stranding of descending colon      consistent with early to mild  diverticulitis versus segmental      colitis.  At time of admission, the patient was placed on empiric      Flagyl.  Low suspicion for C. diff colitis as the patient with no      recurrent stools since stopping p.o. Augmentin several days prior      to this admission.  Suspect the patient with adverse drug reaction      in addition to CT findings.  Liver function tests, amylase, lipase      and electrolytes within normal limits throughout hospitalization.      The patient was hydrated with gentle IV fluids for clinical      dehydration.  At time of dictation, the patient's symptoms have      greatly improved; therefore will continue empiric treatment for      early diverticulitis with a total of 7 days p.o. Flagyl.  The      patient was scheduled for a routine colonoscopy in January 2010      prior to this hospitalization; will defer rescheduling this test to  an earlier date to the patient's primary care physician at followup      appointment.   DISCHARGE MEDICATIONS:  1. Flagyl 500 mg p.o. t.i.d. until gone.  2. Nexium 40 mg p.o. b.i.d.  3. Aspirin 81 mg p.o. daily.  4. Fish oil 300 mg p.o. daily.  5. Promethazine 25 mg q.6 hours p.r.n. nausea.  6. Lomotil p.r.n. diarrhea.  7. Valtrex 1 gram p.r.n. coldsores.   DISCHARGE LABORATORY DATA:  White cell count 6.6, platelet count 237,  hemoglobin 15.2, sodium 141, potassium 3.6, BUN 12, creatinine 1.07.  Liver function tests within normal limits.  Amylase and lipase negative.   DISPOSITION:  Again the patient felt medically stable for discharge home  at this time as he is tolerating a regular diet without any recurrent  nausea, vomiting or diarrhea.  The patient does still endorse some mild  left lower quadrant pain, much improved since time of admission.  The  patient instructed to follow up with primary care physician, Dr. Dianna Limbo, on November 25 at 3:30 p.m.      Cordelia Pen, NP      Raenette Rover.  Felicity Coyer, MD  Electronically Signed    LE/MEDQ  D:  08/17/2008  T:  08/17/2008  Job:  [id number]   cc:   Ellin Saba., MD  [address] [place]  [place] [address]

## 2011-03-26 ENCOUNTER — Other Ambulatory Visit: Payer: Self-pay | Admitting: Family Medicine

## 2011-03-26 NOTE — Telephone Encounter (Signed)
Pt req refill Amphetamine 20 mg 3 month supply

## 2011-03-28 NOTE — Telephone Encounter (Signed)
Per Dr. Scotty Court pt must have an appointment for future refills on medicaiton; left a message for pt to return call.

## 2011-04-01 ENCOUNTER — Telehealth: Payer: Self-pay | Admitting: Family Medicine

## 2011-04-01 NOTE — Telephone Encounter (Signed)
Pt called and said that he has been trying to rcv a call back from nurse re: medication. Pt says that he has lft numerous vms with no response. Pt is on his way to [facility] to talk with nurse directly.

## 2011-04-01 NOTE — Telephone Encounter (Signed)
Spoke with pt and made an appointment for April 03, 2011.

## 2011-04-03 ENCOUNTER — Ambulatory Visit (INDEPENDENT_AMBULATORY_CARE_PROVIDER_SITE_OTHER): Payer: PRIVATE HEALTH INSURANCE | Admitting: Family Medicine

## 2011-04-03 ENCOUNTER — Encounter: Payer: Self-pay | Admitting: Family Medicine

## 2011-04-03 DIAGNOSIS — B009 Herpesviral infection, unspecified: Secondary | ICD-10-CM

## 2011-04-03 DIAGNOSIS — F909 Attention-deficit hyperactivity disorder, unspecified type: Secondary | ICD-10-CM

## 2011-04-03 MED ORDER — AMPHETAMINE-DEXTROAMPHETAMINE 20 MG PO TABS: ORAL_TABLET | ORAL | Status: DC

## 2011-04-03 MED ORDER — METOCLOPRAMIDE HCL 10 MG PO TABS: ORAL_TABLET | ORAL | Status: DC

## 2011-04-03 MED ORDER — VALACYCLOVIR HCL 500 MG PO TABS: 500.0000 mg | ORAL_TABLET | Freq: Three times a day (TID) | ORAL | Status: DC

## 2011-04-18 ENCOUNTER — Other Ambulatory Visit (INDEPENDENT_AMBULATORY_CARE_PROVIDER_SITE_OTHER): Payer: PRIVATE HEALTH INSURANCE

## 2011-04-18 DIAGNOSIS — Z0000 Encounter for general adult medical examination without abnormal findings: Secondary | ICD-10-CM

## 2011-04-18 LAB — CBC WITH DIFFERENTIAL/PLATELET
Basophils Absolute: 0.1 10*3/uL (ref 0.0–0.1)
Basophils Relative: 1 % (ref 0.0–3.0)
Eosinophils Absolute: 0.4 10*3/uL (ref 0.0–0.7)
Lymphs Abs: 1.7 10*3/uL (ref 0.7–4.0)
MCHC: 34.7 g/dL (ref 30.0–36.0)
MCV: 96.7 fl (ref 78.0–100.0)
Neutro Abs: 3.9 10*3/uL (ref 1.4–7.7)
Platelets: 234 10*3/uL (ref 150.0–400.0)
RBC: 4.65 Mil/uL (ref 4.22–5.81)
WBC: 6.8 10*3/uL (ref 4.5–10.5)

## 2011-04-18 LAB — POCT URINALYSIS DIPSTICK
Bilirubin, UA: NEGATIVE
Blood, UA: NEGATIVE
Glucose, UA: NEGATIVE
Ketones, UA: NEGATIVE
Leukocytes, UA: NEGATIVE
Nitrite, UA: NEGATIVE
Protein, UA: NEGATIVE
Spec Grav, UA: 1.02
Urobilinogen, UA: 0.2
pH, UA: 5.5

## 2011-04-18 LAB — LIPID PANEL
Cholesterol: 327 mg/dL — ABNORMAL HIGH (ref 0–200)
HDL: 39.5 mg/dL (ref >39.00)

## 2011-04-18 LAB — HEPATIC FUNCTION PANEL
ALT: 28 U/L (ref 0–53)
AST: 29 U/L (ref 0–37)
Albumin: 4.5 g/dL (ref 3.5–5.2)
Bilirubin, Direct: 0.1 mg/dL (ref 0.0–0.3)

## 2011-04-18 LAB — PSA: PSA: 0.42 ng/mL (ref 0.10–4.00)

## 2011-04-18 LAB — TSH: TSH: 4.8 u[IU]/mL (ref 0.35–5.50)

## 2011-04-18 LAB — BASIC METABOLIC PANEL
GFR: 79.38 mL/min (ref >60.00)
Potassium: 4.1 mEq/L (ref 3.5–5.1)

## 2011-04-30 ENCOUNTER — Encounter: Payer: Self-pay | Admitting: Family Medicine

## 2011-04-30 ENCOUNTER — Ambulatory Visit (INDEPENDENT_AMBULATORY_CARE_PROVIDER_SITE_OTHER): Payer: PRIVATE HEALTH INSURANCE | Admitting: Family Medicine

## 2011-04-30 VITALS — BP 110/90 | HR 70 | Temp 98.2°F | Ht 74.0 in | Wt 183.0 lb

## 2011-04-30 DIAGNOSIS — Z0000 Encounter for general adult medical examination without abnormal findings: Secondary | ICD-10-CM

## 2011-04-30 DIAGNOSIS — M1990 Unspecified osteoarthritis, unspecified site: Secondary | ICD-10-CM

## 2011-04-30 DIAGNOSIS — E785 Hyperlipidemia, unspecified: Secondary | ICD-10-CM

## 2011-04-30 DIAGNOSIS — M129 Arthropathy, unspecified: Secondary | ICD-10-CM

## 2011-04-30 DIAGNOSIS — F988 Other specified behavioral and emotional disorders with onset usually occurring in childhood and adolescence: Secondary | ICD-10-CM

## 2011-04-30 MED ORDER — ROSUVASTATIN CALCIUM 10 MG PO TABS: ORAL_TABLET | ORAL | Status: AC

## 2011-04-30 MED ORDER — IBUPROFEN 600 MG PO TABS: ORAL_TABLET | ORAL | Status: DC

## 2011-04-30 NOTE — Progress Notes (Signed)
  Subjective:    Patient ID: Jacob Savage, male    DOB: 07/31/1956, 55 y.o.   MRN: [id number] This 55 year old white married male is in for routine physical examination . His primary complaint is that of joint pains which he related out the medication but then found that it was not do to Lipitor and will like to have some treatment when he needs it. He is going to take ibuprofen 300  milligram 3 times a day. When he needs it HPI    Review of Systems  Constitutional: Negative.   HENT: Negative.   Eyes: Negative.   Respiratory: Negative.   Cardiovascular: Negative.   Gastrointestinal: Negative.   Genitourinary: Negative.   Musculoskeletal: Positive for arthralgias.  Neurological: Negative.   Hematological: Negative.   Psychiatric/Behavioral: Negative.        Objective:   Physical Exam the patient is a well-built well-nourished white male in no distress HEENT ear eye nose and throat are negative carotid pulses are good thyroid is nonpalpable Lungs clear to palpation percussion and auscultation no rales are heard no dullness no wheezing  Heart examination reveals no cardiomegaly heart sounds are good without murmurs peripheral pulsations are good and equal bilaterally Abdomen liver spleen kidneys are nonpalpable no masses felt aorta percusses normal bowel sounds normal Genitalia normal testicles normal no tenderness no nodules Rectal examination negative reveals normal prostate  Examination of extremities negative joints are nontender Neurological no positive finding Skin no lesions     Assessment & Plan:  Routine physical examination reveals a healthy male that has arthritis especially of his knees but no severe nature or ibuprofen 600 mg 3 times a day p.c. if needed Hyperlipidemia to treat with Lipitor and diet History of herpes simplex one, to treat with Valtrex 1000 mg tablets 2 stat repeat in 12 hours

## 2011-04-30 NOTE — Patient Instructions (Signed)
You are a healthy man with hyperlipidemia Start crestor 10 mg each day Return in 3 months for blood test lipids and rhematoid factor Continue regular exercise

## 2011-04-30 NOTE — Progress Notes (Signed)
Quick Note:  Dicussed with pt at cpx visit on 04/30/11 ______

## 2011-04-30 NOTE — Progress Notes (Signed)
Quick Note:  Discussed with pt at cpx visit on 04/30/11 ______

## 2011-05-04 NOTE — Progress Notes (Signed)
  Subjective:    Patient ID: Jacob Savage, male    DOB: [birth year], 55 y.o.   MRN: [id number] This 55 year old white married male is in today to get refill medications for his ADD as well as having an episode of herpes simplex one. He came fasting to get lab studies for an upcoming physical examination HPI    Review of Systems review of systems negative the history of present illness     Objective:   Physical Exam patient is a well-built well-nourished white male who appears to be in no distress and has an obvious lesion on his lip of herpes simplex one No other positive physical findings        Assessment & Plan:  Herpes simplex one with Valtrex A DD refill medication Get fasting lab studies today and return for complete physical

## 2011-05-04 NOTE — Patient Instructions (Signed)
Have renewed her medicines for ADD tenderness as well as herpes simplex and would like you to return for a complete physical examination in the near future we'll get fasting lab work today

## 2011-06-19 ENCOUNTER — Telehealth: Payer: Self-pay | Admitting: *Deleted

## 2011-06-19 NOTE — Telephone Encounter (Signed)
Pt started having muscle cramping on the Crestor, but he also had fever, chills and sweating.  Severe headache.  He went to Urgent Care and was tested for [facility] Fever, and Lymes Disease.  He will call us the results of these tests.  He discontinued the Crestor on his own, and will see how he feels.  Advised him that with all the symptoms he is having, he should see one the MDs here to make sure there is nothing else going on other than Crestor reactions.  Will wait for his test results.

## 2011-06-20 NOTE — Telephone Encounter (Signed)
Pt called back to let us know all his tests were  Negative and he feels much better off the Crestor.  He prefers to stay off meds if he can.  He would like to use Green Coffee Bean Extract.  He would like to try it.  Labs are scheduled July 31, 2011.

## 2011-06-20 NOTE — Telephone Encounter (Signed)
noted 

## 2011-06-30 ENCOUNTER — Telehealth: Payer: Self-pay | Admitting: Family Medicine

## 2011-06-30 NOTE — Telephone Encounter (Signed)
Pt requesting refill on amphetamine-dextroamphetamine (ADDERALL) 20 MG tablet [[id number]]  Please contact when ready to pick up.

## 2011-07-01 ENCOUNTER — Other Ambulatory Visit: Payer: Self-pay

## 2011-07-01 MED ORDER — AMPHETAMINE-DEXTROAMPHETAMINE 20 MG PO TABS: ORAL_TABLET | ORAL | Status: DC

## 2011-07-01 NOTE — Telephone Encounter (Signed)
Ok per Dr. Scotty Court to fill. Pt is aware rx is ready for pickup.

## 2011-07-02 LAB — COMPREHENSIVE METABOLIC PANEL
AST: 35
Albumin: 3.7
CO2: 26
Calcium: 9
Creatinine, Ser: 1.07
GFR calc Af Amer: >60
GFR calc non Af Amer: >60

## 2011-07-02 LAB — CBC
HCT: 43.2
Hemoglobin: 15.2
RBC: 4.65
RDW: 12.2
WBC: 6.6

## 2011-07-02 LAB — AMYLASE: Amylase: 47

## 2011-07-04 LAB — COMPREHENSIVE METABOLIC PANEL
ALT: 25 U/L (ref 0–53)
AST: 23 U/L (ref 0–37)
Albumin: 3.8 g/dL (ref 3.5–5.2)
Alkaline Phosphatase: 53 U/L (ref 39–117)
Chloride: 109 mEq/L (ref 96–112)
GFR calc Af Amer: >60 mL/min (ref >60)
Potassium: 3.7 mEq/L (ref 3.5–5.1)
Sodium: 141 mEq/L (ref 135–145)
Total Bilirubin: 0.7 mg/dL (ref 0.3–1.2)

## 2011-07-04 LAB — URINALYSIS, ROUTINE W REFLEX MICROSCOPIC
Glucose, UA: NEGATIVE mg/dL
Nitrite: NEGATIVE
Protein, ur: NEGATIVE mg/dL

## 2011-07-04 LAB — DIFFERENTIAL
Basophils Absolute: 0.1 10*3/uL (ref 0.0–0.1)
Basophils Relative: 1 % (ref 0–1)
Eosinophils Relative: 3 % (ref 0–5)
Monocytes Absolute: 0.5 10*3/uL (ref 0.1–1.0)
Monocytes Relative: 8 % (ref 3–12)

## 2011-07-04 LAB — CBC
HCT: 42.3 % (ref 39.0–52.0)
Platelets: 278 10*3/uL (ref 150–400)
WBC: 6.5 10*3/uL (ref 4.0–10.5)

## 2011-07-31 ENCOUNTER — Other Ambulatory Visit (INDEPENDENT_AMBULATORY_CARE_PROVIDER_SITE_OTHER): Payer: PRIVATE HEALTH INSURANCE

## 2011-07-31 DIAGNOSIS — E785 Hyperlipidemia, unspecified: Secondary | ICD-10-CM

## 2011-07-31 LAB — LIPID PANEL
Cholesterol: 229 mg/dL — ABNORMAL HIGH (ref 0–200)
HDL: 43.8 mg/dL (ref >39.00)
Total CHOL/HDL Ratio: 5
Triglycerides: 185 mg/dL — ABNORMAL HIGH (ref 0.0–149.0)

## 2011-08-01 LAB — RHEUMATOID FACTOR: Rhuematoid fact SerPl-aCnc: <10 IU/mL (ref <=14)

## 2011-08-12 ENCOUNTER — Telehealth: Payer: Self-pay | Admitting: Family Medicine

## 2011-08-12 NOTE — Telephone Encounter (Signed)
Pt called and said that he had called at the end of Sept re: going off of Crestor and also questioning whether it would be ok to use Green Coffee Bean Extract to help treat high cholesterol. Pt started using this supplement and found that it has not had any side effects, but pt is wondering what his cholesterol lvls were from labs drawn on 07/31/11? Pls call with results.

## 2011-08-19 NOTE — Telephone Encounter (Signed)
Pt is aware of lab results and that he needs to establish with a new pcp.

## 2011-09-25 ENCOUNTER — Other Ambulatory Visit: Payer: Self-pay | Admitting: Family Medicine

## 2011-09-26 ENCOUNTER — Telehealth: Payer: Self-pay

## 2011-09-26 NOTE — Telephone Encounter (Signed)
Called pt to see if he had found a new pcp. Left a message for a return call.

## 2011-12-15 NOTE — Progress Notes (Signed)
Left a message for pt to return call 

## 2011-12-18 NOTE — Progress Notes (Signed)
Quick Note:  Pt aware. Pt states he will going to [facility]. ______

## 2012-02-18 DIAGNOSIS — B351 Tinea unguium: Secondary | ICD-10-CM | POA: Insufficient documentation

## 2012-02-18 DIAGNOSIS — B009 Herpesviral infection, unspecified: Secondary | ICD-10-CM | POA: Insufficient documentation

## 2012-03-11 ENCOUNTER — Ambulatory Visit: Payer: BC Managed Care – PPO | Admitting: Physical Therapy

## 2013-11-01 DIAGNOSIS — Z87891 Personal history of nicotine dependence: Secondary | ICD-10-CM | POA: Insufficient documentation

## 2014-03-20 DIAGNOSIS — R0683 Snoring: Secondary | ICD-10-CM | POA: Insufficient documentation

## 2015-05-14 DIAGNOSIS — Z8719 Personal history of other diseases of the digestive system: Secondary | ICD-10-CM | POA: Insufficient documentation

## 2016-03-27 ENCOUNTER — Encounter: Payer: Self-pay | Admitting: Internal Medicine

## 2016-06-03 ENCOUNTER — Ambulatory Visit: Payer: PRIVATE HEALTH INSURANCE | Admitting: Internal Medicine

## 2016-06-03 ENCOUNTER — Encounter: Payer: Self-pay | Admitting: Gastroenterology

## 2016-06-05 ENCOUNTER — Encounter: Payer: Self-pay | Admitting: Internal Medicine

## 2016-06-05 ENCOUNTER — Ambulatory Visit (INDEPENDENT_AMBULATORY_CARE_PROVIDER_SITE_OTHER): Payer: BLUE CROSS/BLUE SHIELD | Admitting: Internal Medicine

## 2016-06-05 ENCOUNTER — Encounter (INDEPENDENT_AMBULATORY_CARE_PROVIDER_SITE_OTHER): Payer: Self-pay

## 2016-06-05 VITALS — BP 114/72 | HR 56 | Ht 74.0 in | Wt 192.0 lb

## 2016-06-05 DIAGNOSIS — K222 Esophageal obstruction: Secondary | ICD-10-CM

## 2016-06-05 DIAGNOSIS — R1314 Dysphagia, pharyngoesophageal phase: Secondary | ICD-10-CM

## 2016-06-05 DIAGNOSIS — R1310 Dysphagia, unspecified: Secondary | ICD-10-CM

## 2016-06-05 DIAGNOSIS — R1319 Other dysphagia: Secondary | ICD-10-CM

## 2016-06-05 NOTE — Progress Notes (Signed)
Assessment & Plan:   Encounter Diagnoses  Name Primary?  . Esophageal dysphagia Yes  . Esophageal stricture    Needs to take PPI daily not prn to Reduce chances of stricture recurrence Explained this and he understands and will take 20 mg omeprazole daily before breakfast. We'll consider dose escalation over time.  Will schedule for EGD and balloon dilation again The risks and benefits as well as alternatives of endoscopic procedure(s) have been discussed and reviewed. All questions answered. The patient agrees to proceed.    Current Outpatient Prescriptions:  .  ezetimibe (ZETIA) 10 MG tablet, Take 10 mg by mouth daily., Disp: , Rfl:  .  fenofibrate (TRICOR) 145 MG tablet, Take 145 mg by mouth daily., Disp: , Rfl:  .  Multiple Vitamin (MULTIVITAMIN) tablet, Take 1 tablet by mouth daily., Disp: , Rfl:  .  Omega-3 Fatty Acids (FISH OIL OMEGA-3 PO), Take 2 capsules by mouth daily., Disp: , Rfl:  .  omeprazole (PRILOSEC) 20 MG capsule, Take 20 mg by mouth daily. , Disp: , Rfl:  .  valACYclovir (VALTREX) 500 MG tablet, TAKE 1 TABLET (500 MG TOTAL) BY MOUTH 3 (THREE) TIMES DAILY. (Patient taking differently: TAKE 1 TABLET (500 MG TOTAL) BY MOUTH PRN), Disp: 30 tablet, Rfl: 1  Subjective:    Patient ID: Jacob Savage, male    DOB: [birth year], 58 y.o.   MRN: [id number] Chief complaint: Swallowing problems HPI Nice 60 year old white man known to me from prior treatment of esophageal stricture in the setting of GERD. Last dilation in January 2010 to 18 mm with balloon, prior to that dilation in 2004 up to 17 mm Savary. At this point is having mild recurrent intermittent solid food dysphagia to bread and meats, he had a severe episode several weeks ago where it sounds like he had a food impaction for about an hour and couldn't clear his saliva and eventually it past but he had quite a bit of left chest pain associated with that and said he was in somewhat distress he had a panic attack and  almost passed out. No recurrence like that. He does not take his omeprazole every day he tries to gauge it to take it when he feels like he has some heartburn. His GI review of systems is otherwise negative. There is no unintentional weight loss. 2-3 caffeinated beverages a day 1-2 alcoholic beverages a day. Does not smoke.  Medications, allergies, past medical history, past surgical history, family history and social history are reviewed and updated in the EMR.  Review of Systems    As per history of present illness all of the other review of systems are negative though he is under some stress helping to care for his elderly parents, his wife works in [place], his daughter is about to get married, [place] and [place]. [place] live in the [place] area.  Objective:   Physical Exam @BP  114/72   Pulse (!) 56   Ht 6\' 2"  (1.88 m)   Wt 192 lb (87.1 kg)   BMI 24.65 kg/m @  General:  Well-developed, well-nourished and in no acute distress Eyes:  anicteric. ENT:   Mouth and posterior pharynx free of lesions.  Neck:   supple w/o thyromegaly or mass.  Lungs: Clear to auscultation bilaterally. Heart:  S1S2, no rubs, murmurs, gallops. Abdomen:  soft, non-tender, no hepatosplenomegaly, hernia, or mass and BS+.  Lymph:  no cervical or supraclavicular adenopathy. Extremities:   no edema, cyanosis or clubbing Skin   no rash. Neuro:  A&O x 3.  Psych:  appropriate mood and  Affect.   Data Reviewed:  Upper endoscopy colonoscopy reports pathology prior GI notes reviewed. Primary care notes reviewed, from 2017.   normal CBC in August as well.   I appreciate the opportunity to care for this patient. CC: Aura Dials, MD

## 2016-06-05 NOTE — Patient Instructions (Signed)
   You have been scheduled for an endoscopy. Please follow written instructions given to you at your visit today. If you use inhalers (even only as needed), please bring them with you on the day of your procedure. Your physician has requested that you go to www.startemmi.com and enter the access code given to you at your visit today. This web site gives a general overview about your procedure. However, you should still follow specific instructions given to you by our office regarding your preparation for the procedure.     Take your omeprazole daily before breakfast.      I appreciate the opportunity to care for you. Stan Headarl Gessner, MD, [facility] Institute[facility]AC[facility]

## 2016-07-15 ENCOUNTER — Encounter: Payer: BLUE CROSS/BLUE SHIELD | Admitting: Internal Medicine

## 2016-07-28 ENCOUNTER — Encounter: Payer: Self-pay | Admitting: Internal Medicine

## 2016-08-04 ENCOUNTER — Encounter: Payer: Self-pay | Admitting: Internal Medicine

## 2016-08-04 ENCOUNTER — Ambulatory Visit (AMBULATORY_SURGERY_CENTER): Payer: BLUE CROSS/BLUE SHIELD | Admitting: Internal Medicine

## 2016-08-04 VITALS — BP 130/57 | HR 55 | Temp 97.8°F | Resp 11 | Ht 74.0 in | Wt 198.0 lb

## 2016-08-04 DIAGNOSIS — R1310 Dysphagia, unspecified: Secondary | ICD-10-CM

## 2016-08-04 DIAGNOSIS — K222 Esophageal obstruction: Secondary | ICD-10-CM

## 2016-08-04 MED ORDER — SODIUM CHLORIDE 0.9 % IV SOLN: 500.0000 mL | INTRAVENOUS | Status: AC

## 2016-08-04 NOTE — Progress Notes (Signed)
A/ox3, pleased with MAC, report to RN 

## 2016-08-04 NOTE — Op Note (Signed)
[facility] Patient Name: Jacob Savage Procedure Date: 08/04/2016 10:27 AM MRN: [id number] Endoscopist: Jacob Boop , MD Age: 60 Referring MD:  Date of Birth: [birth year] Gender: Male Account #: [id number] Procedure:                Upper GI endoscopy Indications:              Dysphagia, Stenosis of the esophagus, For therapy                            of esophageal stenosis Medicines:                Propofol per Anesthesia, Monitored Anesthesia Care Procedure:                Pre-Anesthesia Assessment:                           - Prior to the procedure, a History and Physical                            was performed, and patient medications and                            allergies were reviewed. The patient's tolerance of                            previous anesthesia was also reviewed. The risks                            and benefits of the procedure and the sedation                            options and risks were discussed with the patient.                            All questions were answered, and informed consent                            was obtained. Prior Anticoagulants: The patient has                            taken no previous anticoagulant or antiplatelet                            agents. ASA Grade Assessment: II - A patient with                            mild systemic disease. After reviewing the risks                            and benefits, the patient was deemed in                            satisfactory condition to undergo the procedure.  After obtaining informed consent, the endoscope was                            passed under direct vision. Throughout the                            procedure, the patient's blood pressure, pulse, and                            oxygen saturations were monitored continuously. The                            Model GIF-HQ190 [id number]) scope was introduced                            through  the mouth, and advanced to the prepyloric                            region, stomach. The upper GI endoscopy was                            accomplished without difficulty. The patient                            tolerated the procedure well. Scope In: Scope Out: Findings:                 One moderate benign-appearing, intrinsic stenosis                            was found at the gastroesophageal junction. This                            measured 1.5 cm (inner diameter) and was traversed.                            A TTS dilator was passed through the scope.                            Dilation with a 16-17-18 mm balloon dilator was                            performed to 18 mm. The dilation site was examined                            and showed moderate improvement in luminal                            narrowing. Estimated blood loss was minimal.                           A 4 cm hiatal hernia was present.                           The  exam was otherwise without abnormality.                           The cardia and gastric fundus were normal on                            retroflexion. Complications:            No immediate complications. Estimated Blood Loss:     Estimated blood loss was minimal. Impression:               - Benign-appearing esophageal stenosis. Dilated.                           - 4 cm hiatal hernia.                           - The examination was otherwise normal.                           - No specimens collected. Recommendation:           - Patient has a contact number available for                            emergencies. The signs and symptoms of potential                            delayed complications were discussed with the                            patient. Return to normal activities tomorrow.                            Written discharge instructions were provided to the                            patient.                           - Continue present  medications.                           - Clear liquids x 1 hour then soft foods rest of                            day. Start prior diet tomorrow [Duration]. Jacob Savage E Jacob Monestime, MD 08/04/2016 10:53:26 AM This report has been signed electronically.

## 2016-08-04 NOTE — Patient Instructions (Addendum)
  I dilated the esophagus - should fix things along with staying on acid-blocking medication.  If still having problems could need larger dilation - call me back if so.  I appreciate the opportunity to care for you.  Iva Booparl E. Daijah Scrivens, MD, FACG     YOU HAD AN ENDOSCOPIC PROCEDURE TODAY AT THE [facility]:   Refer to the procedure report that was given to you for any specific questions about what was found during the examination.  If the procedure report does not answer your questions, please call your gastroenterologist to clarify.  If you requested that your care partner not be given the details of your procedure findings, then the procedure report has been included in a sealed envelope for you to review at your convenience later.  YOU SHOULD EXPECT: Some feelings of bloating in the abdomen. Passage of more gas than usual.  Walking can help get rid of the air that was put into your GI tract during the procedure and reduce the bloating. If you had a lower endoscopy (such as a colonoscopy or flexible sigmoidoscopy) you may notice spotting of blood in your stool or on the toilet paper. If you underwent a bowel prep for your procedure, you may not have a normal bowel movement for a few days.  Please Note:  You might notice some irritation and congestion in your nose or some drainage.  This is from the oxygen used during your procedure.  There is no need for concern and it should clear up in a day or so.  SYMPTOMS TO REPORT IMMEDIATELY:     Following upper endoscopy (EGD)  Vomiting of blood or coffee ground material  New chest pain or pain under the shoulder blades  Painful or persistently difficult swallowing  New shortness of breath  Fever of 100F or higher  Black, tarry-looking stools  For urgent or emergent issues, a gastroenterologist can be reached at any hour by calling [phone].   DIET: Follow Dilation Diet.    ACTIVITY:  You should plan to take it easy  for the rest of today and you should NOT DRIVE or use heavy machinery until tomorrow (because of the sedation medicines used during the test).    FOLLOW UP: Our staff will call the number listed on your records the next business day following your procedure to check on you and address any questions or concerns that you may have regarding the information given to you following your procedure. If we do not reach you, we will leave a message.  However, if you are feeling well and you are not experiencing any problems, there is no need to return our call.  We will assume that you have returned to your regular daily activities without incident.  If any biopsies were taken you will be contacted by phone or by letter within the next 1-3 weeks.  Please call us at [phone] if you have not heard about the biopsies in 3 weeks.    SIGNATURES/CONFIDENTIALITY: You and/or your care partner have signed paperwork which will be entered into your electronic medical record.  These signatures attest to the fact that that the information above on your After Visit Summary has been reviewed and is understood.  Full responsibility of the confidentiality of this discharge information lies with you and/or your care-partner.   Resume medications. Dilation Diet Given and Hiatal Hernia.

## 2016-08-04 NOTE — Progress Notes (Signed)
Called to room to assist during endoscopic procedure.  Patient ID and intended procedure confirmed with present staff. Received instructions for my participation in the procedure from the performing physician.  

## 2016-08-05 ENCOUNTER — Telehealth: Payer: Self-pay

## 2016-08-05 NOTE — Telephone Encounter (Signed)
  Follow up Call-  Call back number 08/04/2016  Post procedure Call Back phone  # [phone]  Permission to leave phone message Yes  Some recent data might be hidden     Patient questions:  Do you have a fever, pain , or abdominal swelling? No. Pain Score  0 *  Have you tolerated food without any problems? Yes.    Have you been able to return to your normal activities? Yes.    Do you have any questions about your discharge instructions: Diet   No. Medications  No. Follow up visit  No.  Do you have questions or concerns about your Care? No.  Actions: * If pain score is 4 or above: No action needed, pain <4.

## 2018-12-08 ENCOUNTER — Encounter: Payer: Self-pay | Admitting: Internal Medicine
